# Patient Record
Sex: Male | Born: 1990 | Race: White | Hispanic: No | Marital: Single | Smoking: Current some day smoker
Health system: Southern US, Community
[De-identification: ages and names within clinical notes are randomized; demographics above are authoritative.]

---

## 2018-06-27 ENCOUNTER — Emergency Department (HOSPITAL_COMMUNITY): Payer: 59

## 2018-06-27 ENCOUNTER — Other Ambulatory Visit: Payer: Self-pay

## 2018-06-27 ENCOUNTER — Emergency Department (HOSPITAL_COMMUNITY)
Admission: EM | Admit: 2018-06-27 | Discharge: 2018-06-28 | Disposition: A | Discharge status: Home / Self Care | Payer: 59 | Attending: Emergency Medicine | Admitting: Emergency Medicine

## 2018-06-27 DIAGNOSIS — R002 Palpitations: Secondary | ICD-10-CM | POA: Diagnosis present

## 2018-06-27 DIAGNOSIS — R42 Dizziness and giddiness: Secondary | ICD-10-CM | POA: Diagnosis not present

## 2018-06-27 DIAGNOSIS — I4891 Unspecified atrial fibrillation: Secondary | ICD-10-CM | POA: Insufficient documentation

## 2018-06-27 LAB — CBC
HCT: 47 % (ref 39.0–52.0)
Hemoglobin: 15.5 g/dL (ref 13.0–17.0)
MCH: 30.4 pg (ref 26.0–34.0)
MCHC: 33 g/dL (ref 30.0–36.0)
MCV: 92.2 fL (ref 80.0–100.0)
Platelets: 251 10*3/uL (ref 150–400)
RBC: 5.1 MIL/uL (ref 4.22–5.81)
RDW: 12.5 % (ref 11.5–15.5)
WBC: 7.6 10*3/uL (ref 4.0–10.5)
nRBC: 0 % (ref 0.0–0.2)

## 2018-06-27 LAB — BASIC METABOLIC PANEL
Anion gap: 9 (ref 5–15)
BUN: 11 mg/dL (ref 6–20)
CHLORIDE: 103 mmol/L (ref 98–111)
CO2: 25 mmol/L (ref 22–32)
CREATININE: 1.01 mg/dL (ref 0.61–1.24)
Calcium: 9.5 mg/dL (ref 8.9–10.3)
GFR calc Af Amer: >60 mL/min (ref >60)
GFR calc non Af Amer: >60 mL/min (ref >60)
Glucose, Bld: 104 mg/dL — ABNORMAL HIGH (ref 70–99)
Potassium: 3.8 mmol/L (ref 3.5–5.1)
SODIUM: 137 mmol/L (ref 135–145)

## 2018-06-27 LAB — RAPID URINE DRUG SCREEN, HOSP PERFORMED
Amphetamines: NOT DETECTED
Barbiturates: NOT DETECTED
Benzodiazepines: NOT DETECTED
Cocaine: NOT DETECTED
Opiates: NOT DETECTED
Tetrahydrocannabinol: NOT DETECTED

## 2018-06-27 LAB — TSH: TSH: 2.839 u[IU]/mL (ref 0.350–4.500)

## 2018-06-27 LAB — MAGNESIUM: Magnesium: 2.1 mg/dL (ref 1.7–2.4)

## 2018-06-27 MED ORDER — RIVAROXABAN 15 MG PO TABS
15.0000 mg | ORAL_TABLET | Freq: Once | ORAL | Status: AC
  Administered 2018-06-28: 15 mg via ORAL
  Filled 2018-06-27: qty 1

## 2018-06-27 MED ORDER — RIVAROXABAN 20 MG PO TABS: 20.0000 mg | ORAL_TABLET | Freq: Every day | ORAL | 0 refills | Status: AC

## 2018-06-27 MED ORDER — ETOMIDATE 2 MG/ML IV SOLN
10.0000 mg | Freq: Once | INTRAVENOUS | Status: AC
  Administered 2018-06-27: 10 mg via INTRAVENOUS
  Filled 2018-06-27: qty 10

## 2018-06-27 NOTE — ED Notes (Signed)
Time change to 2340

## 2018-06-27 NOTE — ED Provider Notes (Signed)
[facility] Provider Note   CSN: [id number] Arrival date & time: 06/27/18  2056     History   Chief Complaint Chief Complaint  Patient presents with  . Atrial Fibrillation    HPI Bradley Perkins is a 27 y.o. male.  HPI   Patient is a 27 year old male without significant past medical history who presents via POV from home accompanied by his girlfriend for evaluation of 2 hours of palpitations.  Patient endorses a prior similar episode that only lasted 5 minutes.  He denies any chest pain, cough, shortness of breath, abdominal pain, vomiting, diarrhea, dysuria, blood in his stool, blood in his urine, headache, earache, sore throat, extremity pain, extremity weakness, rash, or other acute complaints.  Denies recent EtOH use or illicit drug use.  States he drinks 2 cups of coffee and espresso most days and that he had this shortly prior to the onset of his symptoms today.  Denies any alleviating or aggravating factors.  No past medical history on file.   Patient denies any significant past medical history, surgical history, tobacco abuse, EtOH use, or illicit drug use.  There are no active problems to display for this patient.     Home Medications    Prior to Admission medications   Medication Sig Start Date End Date Taking? Authorizing Provider  rivaroxaban (XARELTO) 20 MG TABS tablet Take 1 tablet (20 mg total) by mouth daily with supper. 06/27/18   Antoine PrimasSmith, Cathaleen Korol, MD    Family History No family history on file.  Social History Social History   Tobacco Use  . Smoking status: Not on file  Substance Use Topics  . Alcohol use: Not on file  . Drug use: Not on file     Allergies   Patient has no known allergies.   Review of Systems Review of Systems  Constitutional: Negative for chills and fever.  HENT: Negative for ear pain and sore throat.   Eyes: Negative for pain and visual disturbance.  Respiratory: Negative for cough  and shortness of breath.   Cardiovascular: Positive for palpitations. Negative for chest pain.  Gastrointestinal: Negative for abdominal pain and vomiting.  Genitourinary: Negative for dysuria and hematuria.  Musculoskeletal: Negative for arthralgias and back pain.  Skin: Negative for color change and rash.  Neurological: Positive for light-headedness. Negative for seizures and syncope.  All other systems reviewed and are negative.    Physical Exam Updated Vital Signs BP (!) 130/112   Pulse (!) 156   Temp 98.6 F (37 C) (Oral)   Resp 20   Ht 5\' 11"  (1.803 m)   Wt 82.6 kg   SpO2 100%   BMI 25.38 kg/m   Physical Exam Vitals signs and nursing note reviewed.  Constitutional:      Appearance: Normal appearance. He is well-developed and normal weight.  HENT:     Head: Normocephalic and atraumatic.     Right Ear: External ear normal.     Left Ear: External ear normal.     Nose: Nose normal.     Mouth/Throat:     Mouth: Mucous membranes are moist.  Eyes:     Conjunctiva/sclera: Conjunctivae normal.     Pupils: Pupils are equal, round, and reactive to light.  Neck:     Musculoskeletal: Neck supple.  Cardiovascular:     Rate and Rhythm: Tachycardia present. Rhythm irregular.     Pulses: Normal pulses.     Heart sounds: No murmur.  Pulmonary:  Effort: Pulmonary effort is normal. No respiratory distress.     Breath sounds: Normal breath sounds.  Abdominal:     Palpations: Abdomen is soft.     Tenderness: There is no abdominal tenderness.  Skin:    General: Skin is warm and dry.  Neurological:     Mental Status: He is alert.      ED Treatments / Results  Labs (all labs ordered are listed, but only abnormal results are displayed) Labs Reviewed  BASIC METABOLIC PANEL - Abnormal; Notable for the following components:      Result Value   Glucose, Bld 104 (*)    All other components within normal limits  CBC  MAGNESIUM  TSH  RAPID URINE DRUG SCREEN, HOSP  PERFORMED    EKG EKG Interpretation  Date/Time:  Friday June 27 2018 23:48:17 EST Ventricular Rate:  92 PR Interval:    QRS Duration: 84 QT Interval:  327 QTC Calculation: 405 R Axis:   59 Text Interpretation:  Sinus rhythm ST elev, probable normal early repol pattern When compared to prior, resolution of Afib.  No STEMI Confirmed by Theda Belfast ([id number]) on 06/27/2018 11:50:38 PM   Radiology Dg Chest 2 View  Result Date: 06/27/2018 CLINICAL DATA:  Irregular rapid heartbeat EXAM: CHEST - 2 VIEW COMPARISON:  None. FINDINGS: The heart size and mediastinal contours are within normal limits. Both lungs are clear. The visualized skeletal structures are unremarkable. IMPRESSION: No active cardiopulmonary disease. Electronically Signed   By: Sherian Rein M.D.   On: 06/27/2018 22:06    Procedures .Cardioversion Date/Time: 06/27/2018 11:52 PM Performed by: Antoine Primas, MD Authorized by: Antoine Primas, MD   Consent:    Consent obtained:  Written   Consent given by:  Patient   Risks discussed:  Induced arrhythmia, pain and death   Alternatives discussed:  No treatment Pre-procedure details:    Cardioversion basis:  Elective   Rhythm:  Atrial fibrillation Patient sedated: Yes. Refer to sedation procedure documentation for details of sedation.  Attempt one:    Cardioversion mode:  Synchronous   Shock (Joules):  150   Shock outcome:  Conversion to normal sinus rhythm Post-procedure details:    Patient status:  Alert   Patient tolerance of procedure:  Tolerated well, no immediate complications   (including critical care time)  Medications Ordered in ED Medications  etomidate (AMIDATE) injection 10 mg (has no administration in time range)  Rivaroxaban (XARELTO) tablet 15 mg (has no administration in time range)     Initial Impression / Assessment and Plan / ED Course  I have reviewed the triage vital signs and the nursing notes.  Pertinent labs & imaging results  that were available during my care of the patient were reviewed by me and considered in my medical decision making (see chart for details).     Patient is a 27 year old male who presents above-stated history exam.  On presentation patient is afebrile with heart rate of 156 and otherwise stable vital signs.  Exam as above.  EKG remarkable for A. fib with RVR with a ventricular rate in the 160s.  No other signs of acute ischemic change suggestive of ACS.  Chest x-ray is unremarkable for focal consolidation that would suggest pneumonia, pneumothorax, cardiomegaly, pulmonary edema, or other acute intrathoracic abnormality within limits of the study.  BMP shows no significant electrolyte abnormalities with anion gap of 9.  CBC WNL.  Magnesium WNL.  Cardioversion performed as above.  Please see above procedure note for  further details.  Post procedure EKG remarkable for normal sinus rhythm with ventricular rate in the 90s.  Following the procedure patient continued to be alert and awake and denied any symptoms including palpitations, chest pain, or shortness of breath.  He was alert and oriented x4.  [place] Vas score 0.  Patient given 1 dose of Xarelto and a prescription for 30 days of Xarelto.  Was instructed to follow-up with A. fib clinic soon as possible.  Patient discharged in stable condition.  Strict return precautions advised and discussed.  Final Clinical Impressions(s) / ED Diagnoses   Final diagnoses:  Atrial fibrillation with RVR [facility](HCC)    ED Discharge Orders         Ordered    Amb referral to AFIB Clinic     06/27/18 2235    rivaroxaban (XARELTO) 20 MG TABS tablet  Daily with supper     06/27/18 2345           Antoine PrimasSmith, Quartez Lagos, MD 06/27/18 2355    Tegeler, Canary Brimhristopher J, MD 06/28/18 0211    Tegeler, Canary Brimhristopher J, MD 07/04/18 1655

## 2018-06-27 NOTE — ED Triage Notes (Signed)
Pt coming by EMS after being seen Urgent Care with tachycardia. Pt reports he drank 2 double shot espressos this evening but that is not an unusual amount for him. States he has had episodes of feeling like his heart rate was increased before but it has never lasted this long. Has no history of diagnosed a-fib. Pt has no complaints of chest pain, nausea, vomiting or distress. Pt is hypertensive. HR is currently in a-fib rythm

## 2018-06-28 NOTE — ED Notes (Signed)
Patient verbalizes understanding of discharge instructions. Opportunity for questioning and answers were provided. Armband removed by staff, pt discharged from ED home via POV.  

## 2018-07-03 ENCOUNTER — Encounter (HOSPITAL_COMMUNITY): Payer: Self-pay | Admitting: Nurse Practitioner

## 2018-07-03 ENCOUNTER — Ambulatory Visit (HOSPITAL_COMMUNITY)
Admission: RE | Admit: 2018-07-03 | Discharge: 2018-07-03 | Disposition: A | Discharge status: Home / Self Care | Payer: BLUE CROSS/BLUE SHIELD | Source: Ambulatory Visit | Attending: Nurse Practitioner | Admitting: Nurse Practitioner

## 2018-07-03 VITALS — BP 176/98 | HR 72 | Ht 71.0 in | Wt 183.0 lb

## 2018-07-03 DIAGNOSIS — I4891 Unspecified atrial fibrillation: Secondary | ICD-10-CM | POA: Diagnosis not present

## 2018-07-03 DIAGNOSIS — R030 Elevated blood-pressure reading, without diagnosis of hypertension: Secondary | ICD-10-CM | POA: Diagnosis not present

## 2018-07-03 DIAGNOSIS — R0683 Snoring: Secondary | ICD-10-CM | POA: Diagnosis not present

## 2018-07-03 DIAGNOSIS — Z79899 Other long term (current) drug therapy: Secondary | ICD-10-CM | POA: Diagnosis not present

## 2018-07-03 DIAGNOSIS — Z7901 Long term (current) use of anticoagulants: Secondary | ICD-10-CM | POA: Diagnosis not present

## 2018-07-03 DIAGNOSIS — F17210 Nicotine dependence, cigarettes, uncomplicated: Secondary | ICD-10-CM | POA: Insufficient documentation

## 2018-07-03 DIAGNOSIS — I482 Chronic atrial fibrillation, unspecified: Secondary | ICD-10-CM

## 2018-07-03 MED ORDER — DILTIAZEM HCL 30 MG PO TABS: ORAL_TABLET | ORAL | 1 refills | Status: AC

## 2018-07-03 NOTE — Progress Notes (Signed)
Primary Care Physician: Patient, No Pcp Per Referring Physician: [facility] f/u   Bradley Perkins is a 28 y.o. male with a h/o tobacco abuse that is in the afib clinic for evaluation after receiving cardioversion for new onset afib that was present for around 45 mins before presentation to the ER.Marland Kitchen He has noted irregular heart beat for just a few minutes at a time previously. He received successful cardioversion.  He states that he smoke and drinks  a lot of caffeine.He has cut back significantly on both. He has been known to drink energy drinks in the past. He  denies illicit drug use and drug screen in the ER was negative. He has been placed on xarelto 20 mg daily for a chadsvscore score of 0. He does snore  but denies apnea. He has been under stress at work and does admit to having an anxious personality.He is in SR today and has not had any further afib. BP elevated today but admits to being nervous  Today, he denies symptoms of palpitations, chest pain, shortness of breath, orthopnea, PND, lower extremity edema, dizziness, presyncope, syncope, or neurologic sequela. The patient is tolerating medications without difficulties and is otherwise without complaint today.   No past medical history on file.   Current Outpatient Medications  Medication Sig Dispense Refill  . rivaroxaban (XARELTO) 20 MG TABS tablet Take 1 tablet (20 mg total) by mouth daily with supper. 30 tablet 0  . diltiazem (CARDIZEM) 30 MG tablet Take 1 tablet every 4 hours AS NEEDED for heart rate >100 as long as blood pressure >100. 45 tablet 1   No current facility-administered medications for this encounter.     No Known Allergies  Social History   Socioeconomic History  . Marital status: Single    Spouse name: Not on file  . Number of children: Not on file  . Years of education: Not on file  . Highest education level: Not on file  Occupational History  . Not on file  Social Needs  . Financial resource strain:  Not on file  . Food insecurity:    Worry: Not on file    Inability: Not on file  . Transportation needs:    Medical: Not on file    Non-medical: Not on file  Tobacco Use  . Smoking status: Current Some Day Smoker    Packs/day: 0.50    Types: Cigarettes  . Smokeless tobacco: Never Used  Substance and Sexual Activity  . Alcohol use: Yes    Alcohol/week: 2.0 standard drinks    Types: 1 Cans of beer, 1 Shots of liquor per week    Comment: per evening  . Drug use: Not on file  . Sexual activity: Not on file  Lifestyle  . Physical activity:    Days per week: Not on file    Minutes per session: Not on file  . Stress: Not on file  Relationships  . Social connections:    Talks on phone: Not on file    Gets together: Not on file    Attends religious service: Not on file    Active member of club or organization: Not on file    Attends meetings of clubs or organizations: Not on file    Relationship status: Not on file  . Intimate partner violence:    Fear of current or ex partner: Not on file    Emotionally abused: Not on file    Physically abused: Not on file  Forced sexual activity: Not on file  Other Topics Concern  . Not on file  Social History Narrative  . Not on file    No family history on file.  ROS- All systems are reviewed and negative except as per the HPI above  Physical Exam: Vitals:   07/03/18 0837  BP: (!) 176/98  Pulse: 72  Weight: 83 kg  Height: 5\' 11"  (1.803 m)   Wt Readings from Last 3 Encounters:  07/03/18 83 kg  06/27/18 82.6 kg    Labs: Lab Results  Component Value Date   NA 137 06/27/2018   K 3.8 06/27/2018   CL 103 06/27/2018   CO2 25 06/27/2018   GLUCOSE 104 (H) 06/27/2018   BUN 11 06/27/2018   CREATININE 1.01 06/27/2018   CALCIUM 9.5 06/27/2018   MG 2.1 06/27/2018   No results found for: INR No results found for: CHOL, HDL, LDLCALC, TRIG   GEN- The patient is well appearing, alert and oriented x 3 today.   Head-  normocephalic, atraumatic Eyes-  Sclera clear, conjunctiva pink Ears- hearing intact Oropharynx- clear Neck- supple, no JVP Lymph- no cervical lymphadenopathy Lungs- Clear to ausculation bilaterally, normal work of breathing Heart- Regular rate and rhythm, no murmurs, rubs or gallops, PMI not laterally displaced GI- soft, NT, ND, + BS Extremities- no clubbing, cyanosis, or edema MS- no significant deformity or atrophy Skin- no rash or lesion Psych- euthymic mood, full affect Neuro- strength and sensation are intact  EKG-NSR, NSST abnormality, pr int 128 ms, qrs int 94 ms, qtc 387 ms   Assessment and Plan: 1. New onset afib General education re afib and lifestyle triggers He is working on smoking cessation and has eliminated caffeine other than one cup in the am Will order echo He admits to snoring but no reported apnea Will Rx cardizem 30 mg as needed if afib returns with instructions how to use He does daily waling and weight is not an issue He will take xarelto 20 mg daily for the 4 weeks following cardioversion and then stop for chadsvasc score of 0  2. Elevated BP Better on recheck at 140/82 Avoid salt  Will call the results of the echo, if no structural abnormality, I do not  think he will need general cardiology f/u, I will encourage  him to start seeing a PCP, Afib clinic as needed  Lupita Leash C. [facility] [address] [place], [place] [address] [phone]

## 2018-07-03 NOTE — Patient Instructions (Signed)
Cardizem 30mg -- take 1 tablet every 4 hours AS NEEDED for heart rate >100 as long as blood pressure >100.   

## 2018-07-14 ENCOUNTER — Ambulatory Visit (HOSPITAL_COMMUNITY)
Admission: RE | Admit: 2018-07-14 | Discharge: 2018-07-14 | Disposition: A | Discharge status: Home / Self Care | Payer: BLUE CROSS/BLUE SHIELD | Source: Ambulatory Visit | Attending: Nurse Practitioner | Admitting: Nurse Practitioner

## 2018-07-14 DIAGNOSIS — I482 Chronic atrial fibrillation, unspecified: Secondary | ICD-10-CM | POA: Diagnosis present

## 2018-07-14 NOTE — Progress Notes (Signed)
  Echocardiogram 2D Echocardiogram has been performed.  Leta Jungling M 07/14/2018, 3:35 PM

## 2018-07-17 ENCOUNTER — Encounter (HOSPITAL_COMMUNITY): Payer: Self-pay | Admitting: *Deleted

## 2019-08-09 IMAGING — DX DG CHEST 2V
2 series · 2 of 2 positions shown · non-contrast
Comparison: None.

CLINICAL DATA: Irregular rapid heartbeat

EXAM:
CHEST - 2 VIEW

[chest pa]
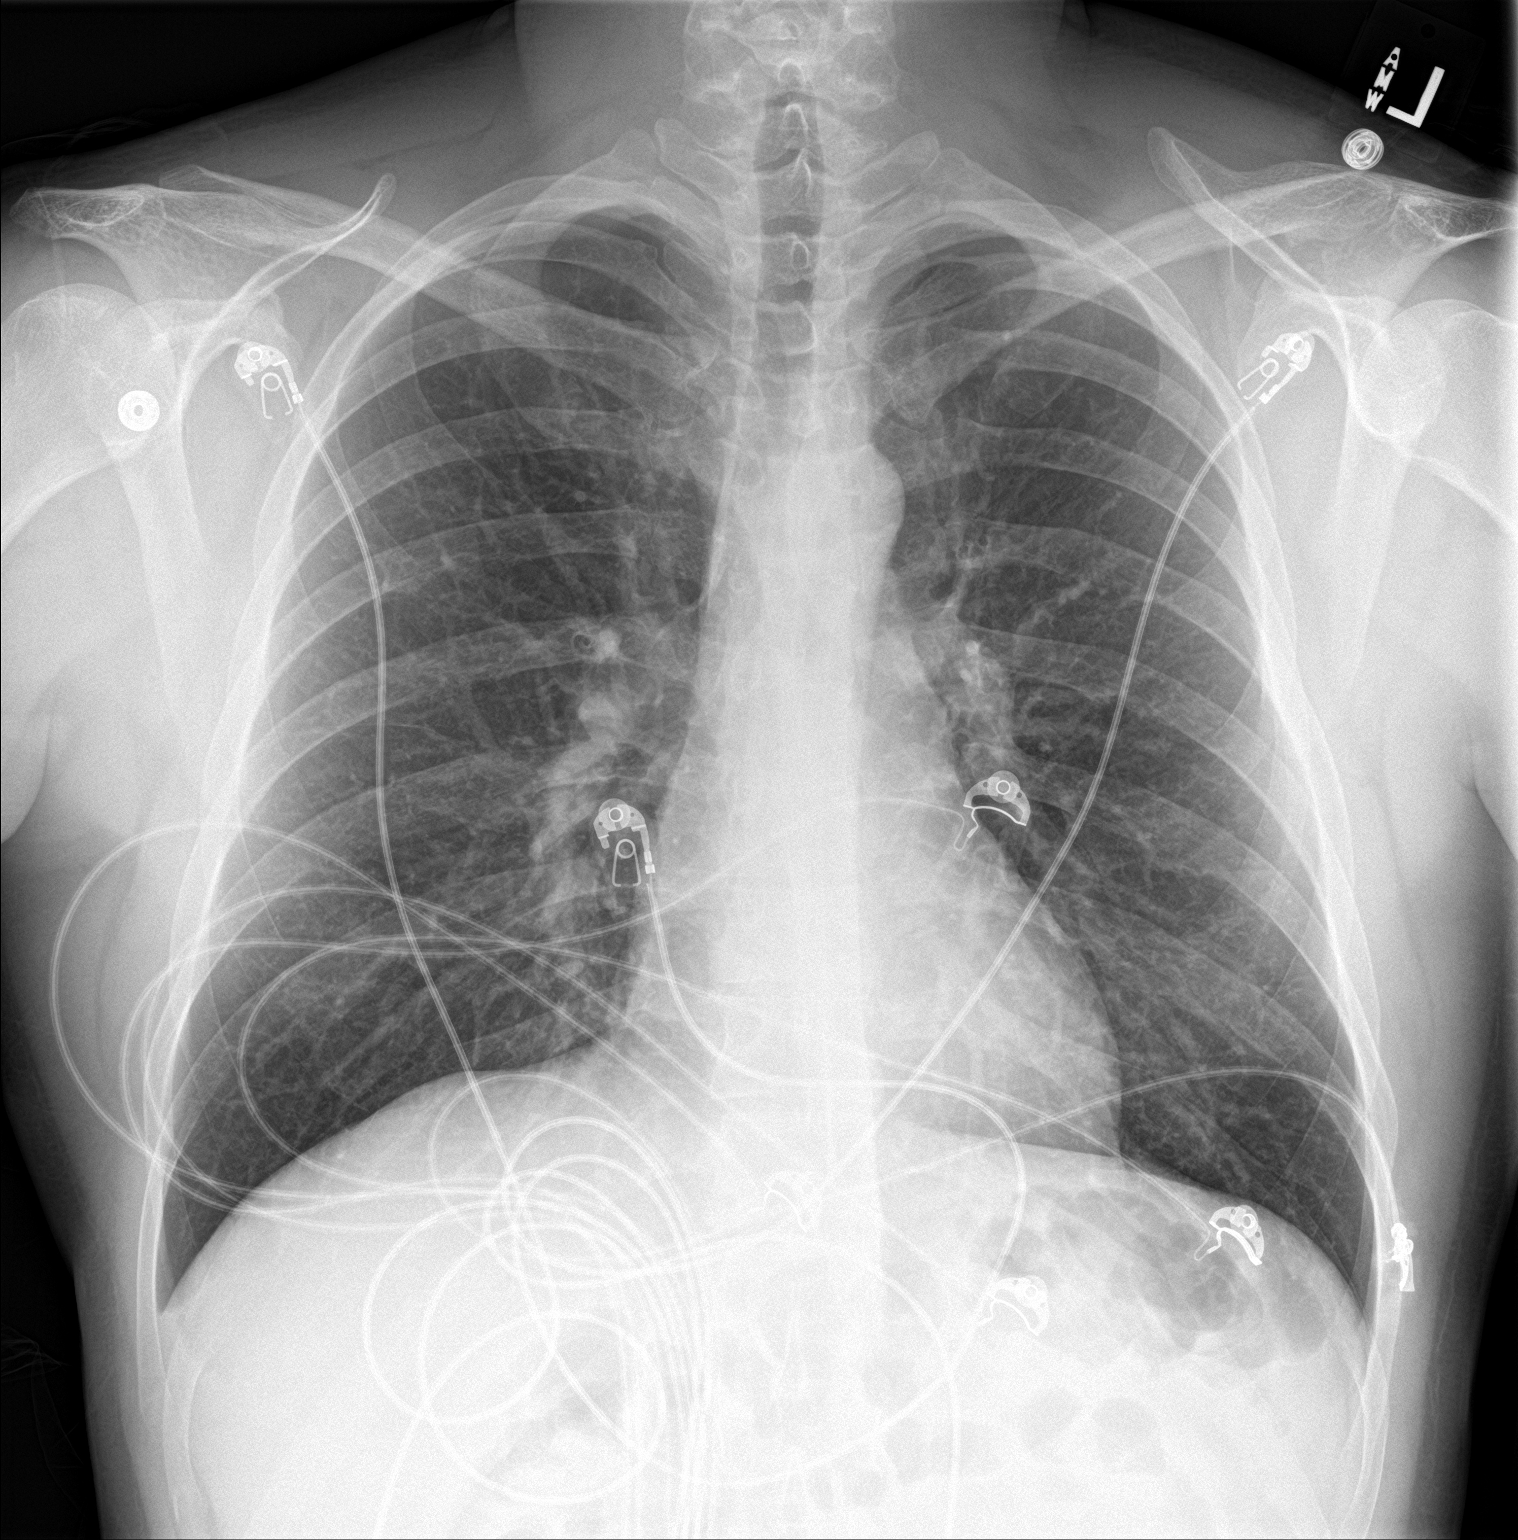

[chest lat]
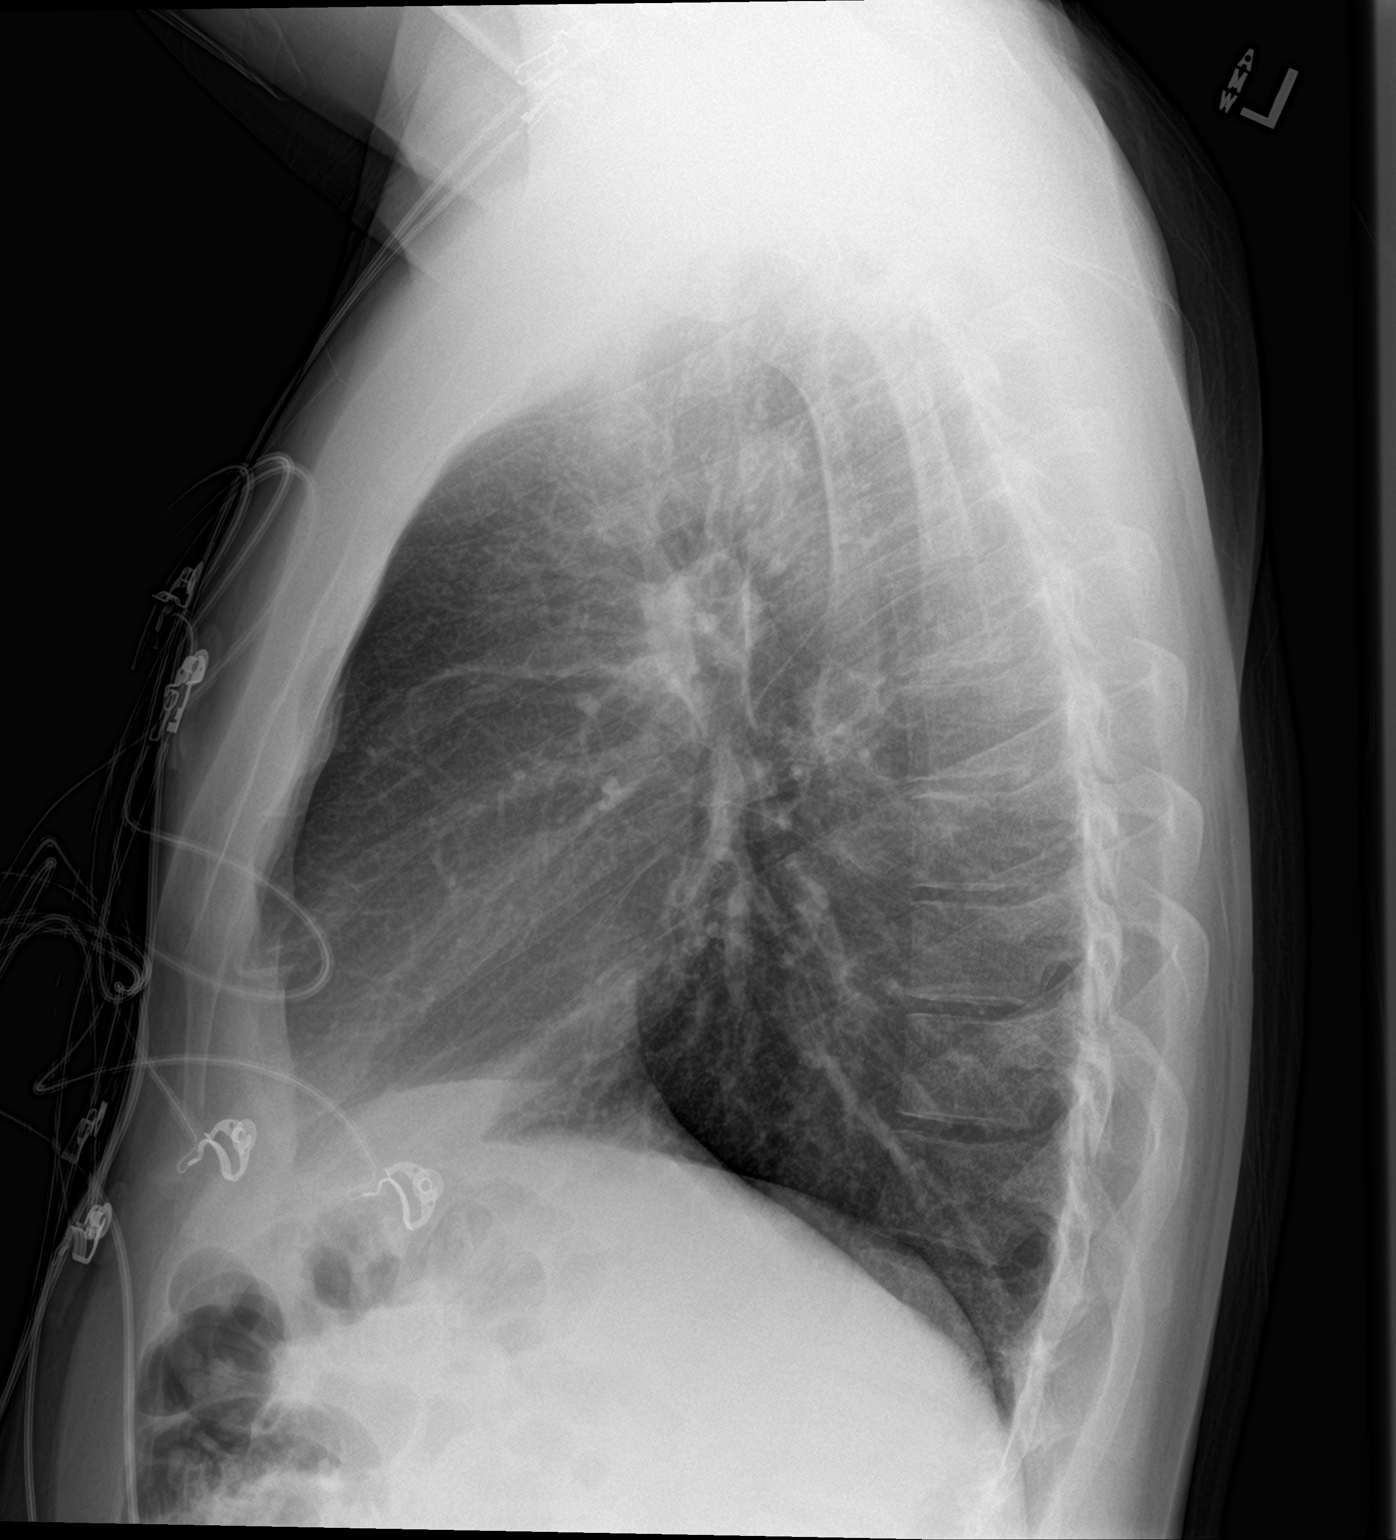

[2 of 2 positions shown; findings below may reference images not displayed]

FINDINGS: The heart size and mediastinal contours are within normal limits.
Both lungs are clear. The visualized skeletal structures are
unremarkable.
IMPRESSION: No active cardiopulmonary disease.
